# Patient Record
Sex: Male | Born: 1985 | Hispanic: Yes | Marital: Married | Smoking: Never smoker
Health system: Southern US, Community
[De-identification: ages and names within clinical notes are randomized; demographics above are authoritative.]

## PROBLEM LIST (undated history)

## (undated) DIAGNOSIS — I7100 Dissection of unspecified site of aorta: Secondary | ICD-10-CM

## (undated) DIAGNOSIS — I10 Essential (primary) hypertension: Secondary | ICD-10-CM

## (undated) HISTORY — PX: CARDIAC SURGERY: SHX584

---

## 2020-12-22 ENCOUNTER — Other Ambulatory Visit: Payer: Self-pay

## 2020-12-22 ENCOUNTER — Emergency Department (HOSPITAL_COMMUNITY): Payer: BLUE CROSS/BLUE SHIELD

## 2020-12-22 ENCOUNTER — Encounter (HOSPITAL_COMMUNITY): Payer: Self-pay | Admitting: Emergency Medicine

## 2020-12-22 ENCOUNTER — Emergency Department (HOSPITAL_COMMUNITY)
Admission: EM | Admit: 2020-12-22 | Discharge: 2020-12-22 | Disposition: A | Discharge status: Home / Self Care | Payer: BLUE CROSS/BLUE SHIELD | Attending: Emergency Medicine | Admitting: Emergency Medicine

## 2020-12-22 DIAGNOSIS — S79911A Unspecified injury of right hip, initial encounter: Secondary | ICD-10-CM | POA: Diagnosis present

## 2020-12-22 DIAGNOSIS — S300XXA Contusion of lower back and pelvis, initial encounter: Secondary | ICD-10-CM | POA: Diagnosis not present

## 2020-12-22 DIAGNOSIS — W108XXA Fall (on) (from) other stairs and steps, initial encounter: Secondary | ICD-10-CM | POA: Insufficient documentation

## 2020-12-22 DIAGNOSIS — S7001XA Contusion of right hip, initial encounter: Secondary | ICD-10-CM | POA: Insufficient documentation

## 2020-12-22 DIAGNOSIS — Y9301 Activity, walking, marching and hiking: Secondary | ICD-10-CM | POA: Diagnosis not present

## 2020-12-22 MED ORDER — ACETAMINOPHEN 500 MG PO TABS: 1000.0000 mg | ORAL_TABLET | Freq: Four times a day (QID) | ORAL | 0 refills | Status: AC | PRN

## 2020-12-22 NOTE — ED Triage Notes (Signed)
Patient slid down 3 carpeted stairs this AM around 0700, has had R hip/buttocks pain, states it got worse while he was at work, is bruised and swollen.

## 2020-12-22 NOTE — ED Provider Notes (Signed)
[facility]-EMERGENCY DEPT Provider Note   CSN: [id number] Arrival date & time: 12/22/20  1704     History Chief Complaint  Patient presents with   Fall   Hip Pain    Jacob Mccann is a 35 y.o. male.  The history is provided by the patient. No language interpreter was used.  Fall  Hip Pain   35 year old male who presents for evaluation of a recent fall.  Patient report this morning he was walking down the steps to make milk for his infant when he misstepped fell and struck his right hip and buttock against the edge of the steps.  He denies hitting his head or loss of consciousness.  Initially the pain was mild but after going to work and walking around for extended period of time he noticed progressive worsening pain to his right hip and buttock region.  Pain is sharp throbbing achy moderate intensity.  He also noticed a large bruise to his right buttock region.  He denies any significant back pain denies any numbness.  No specific treatment tried.  History reviewed. No pertinent past medical history.  There are no problems to display for this patient.   History reviewed. No pertinent surgical history.     No family history on file.     Home Medications Prior to Admission medications   Not on File    Allergies    Patient has no known allergies.  Review of Systems   Review of Systems  Constitutional:  Negative for fever.  Skin:  Negative for wound.  Neurological:  Negative for numbness.   Physical Exam Updated Vital Signs BP (!) 149/106 (BP Location: Right Arm)    Pulse 100    Temp 98.7 F (37.1 C) (Oral)    Resp 18    Ht 5\' 11"  (1.803 m)    Wt 99.8 kg    SpO2 100%    BMI 30.68 kg/m   Physical Exam Vitals and nursing note reviewed.  Constitutional:      General: He is not in acute distress.    Appearance: He is well-developed.  HENT:     Head: Atraumatic.  Eyes:     Conjunctiva/sclera: Conjunctivae normal.  Abdominal:      Palpations: Abdomen is soft.     Tenderness: There is no abdominal tenderness.  Musculoskeletal:        General: Tenderness (Right hip: Tenderness to lateral and posterior hip near the ecchymotic site however hip with full range of motion and able to ambulate) present.     Cervical back: Neck supple.  Skin:    Findings: No rash.     Comments: Right buttock: There is a moderate size ecchymosis noted to the right gluteal region tenderness to palpation but no active bleeding and no deformity noted.  Neurological:     Mental Status: He is alert and oriented to person, place, and time.    ED Results / Procedures / Treatments   Labs (all labs ordered are listed, but only abnormal results are displayed) Labs Reviewed - No data to display  EKG None  Radiology DG Hip Unilat  With Pelvis 2-3 Views Right  Result Date: 12/22/2020 CLINICAL DATA:  Trauma, fall, pain EXAM: DG HIP (WITH OR WITHOUT PELVIS) 2-3V RIGHT COMPARISON:  None. FINDINGS: No fracture or dislocation is seen. Joint spaces in both hips appear symmetrical. IMPRESSION: No radiographic abnormality is seen in the right hip. Electronically Signed   By: 12/24/2020.D.  On: 12/22/2020 17:35    Procedures Procedures   Medications Ordered in ED Medications - No data to display  ED Course  I have reviewed the triage vital signs and the nursing notes.  Pertinent labs & imaging results that were available during my care of the patient were reviewed by me and considered in my medical decision making (see chart for details).    MDM Rules/Calculators/A&P                         BP (!) 149/106 (BP Location: Right Arm)    Pulse 100    Temp 98.7 F (37.1 C) (Oral)    Resp 18    Ht 5\' 11"  (1.803 m)    Wt 99.8 kg    SpO2 100%    BMI 30.68 kg/m      Final Clinical Impression(s) / ED Diagnoses Final diagnoses:  Contusion of right hip, initial encounter    Rx / DC Orders ED Discharge Orders     None      5:53  PM Patient had a mechanical fall earlier today.  He is here with pain to his right buttock and right hip.  He has a large ecchymosis to his right buttock with tenderness however he is able to range his right hip without difficulty.  X-ray of the hip and pelvis region without acute finding.  RICE therapy recommended.  Otherwise patient stable for discharge.  Work note provided per request.   , PA-C 12/22/20 1757    12/24/20, MD 12/22/20 12/24/20

## 2020-12-22 NOTE — Discharge Instructions (Signed)
You have been evaluated for fall.  Fortunately x-ray of your right hip and pelvis did not show any broken bone.  You do have a moderate sized bruise to the right buttock.  Apply ice pack to affected area several times daily to help ease the discomfort, after 2 to 3 days you may transition over to warm bath for comfort.  Take Tylenol as needed for pain.

## 2021-03-28 ENCOUNTER — Emergency Department (HOSPITAL_COMMUNITY): Payer: BC Managed Care – PPO

## 2021-03-28 ENCOUNTER — Emergency Department (HOSPITAL_COMMUNITY)
Admission: EM | Admit: 2021-03-28 | Discharge: 2021-03-28 | Disposition: A | Discharge status: Home / Self Care | Payer: BC Managed Care – PPO | Attending: Emergency Medicine | Admitting: Emergency Medicine

## 2021-03-28 ENCOUNTER — Other Ambulatory Visit: Payer: Self-pay

## 2021-03-28 ENCOUNTER — Encounter (HOSPITAL_COMMUNITY): Payer: Self-pay

## 2021-03-28 DIAGNOSIS — Z79899 Other long term (current) drug therapy: Secondary | ICD-10-CM | POA: Diagnosis not present

## 2021-03-28 DIAGNOSIS — R0602 Shortness of breath: Secondary | ICD-10-CM | POA: Diagnosis not present

## 2021-03-28 DIAGNOSIS — R0789 Other chest pain: Secondary | ICD-10-CM | POA: Insufficient documentation

## 2021-03-28 DIAGNOSIS — Z7982 Long term (current) use of aspirin: Secondary | ICD-10-CM | POA: Diagnosis not present

## 2021-03-28 HISTORY — DX: Dissection of unspecified site of aorta: I71.00

## 2021-03-28 HISTORY — DX: Essential (primary) hypertension: I10

## 2021-03-28 LAB — BASIC METABOLIC PANEL
Anion gap: 9 (ref 5–15)
BUN: 36 mg/dL — ABNORMAL HIGH (ref 6–20)
CO2: 24 mmol/L (ref 22–32)
Calcium: 9.1 mg/dL (ref 8.9–10.3)
Chloride: 99 mmol/L (ref 98–111)
Creatinine, Ser: 1.52 mg/dL — ABNORMAL HIGH (ref 0.61–1.24)
GFR, Estimated: >60 mL/min (ref >60)
Glucose, Bld: 96 mg/dL (ref 70–99)
Potassium: 3.7 mmol/L (ref 3.5–5.1)
Sodium: 132 mmol/L — ABNORMAL LOW (ref 135–145)

## 2021-03-28 LAB — CBC
HCT: 45.5 % (ref 39.0–52.0)
Hemoglobin: 16 g/dL (ref 13.0–17.0)
MCH: 25.7 pg — ABNORMAL LOW (ref 26.0–34.0)
MCHC: 35.2 g/dL (ref 30.0–36.0)
MCV: 73.2 fL — ABNORMAL LOW (ref 80.0–100.0)
Platelets: 242 10*3/uL (ref 150–400)
RBC: 6.22 MIL/uL — ABNORMAL HIGH (ref 4.22–5.81)
RDW: 14.8 % (ref 11.5–15.5)
WBC: 8 10*3/uL (ref 4.0–10.5)
nRBC: 0 % (ref 0.0–0.2)

## 2021-03-28 LAB — TROPONIN I (HIGH SENSITIVITY): Troponin I (High Sensitivity): 2 ng/L (ref <18)

## 2021-03-28 LAB — D-DIMER, QUANTITATIVE: D-Dimer, Quant: 0.43 ug/mL-FEU (ref 0.00–0.50)

## 2021-03-28 MED ORDER — SODIUM CHLORIDE 0.9 % IV BOLUS
500.0000 mL | Freq: Once | INTRAVENOUS | Status: AC
  Administered 2021-03-28: 500 mL via INTRAVENOUS

## 2021-03-28 MED ORDER — IOHEXOL 350 MG/ML SOLN
100.0000 mL | Freq: Once | INTRAVENOUS | Status: AC | PRN
  Administered 2021-03-28: 100 mL via INTRAVENOUS

## 2021-03-28 NOTE — Discharge Instructions (Signed)
Return for any problem.  ?

## 2021-03-28 NOTE — ED Triage Notes (Signed)
Patient c/o SOB and chest pain x 2 days. Patient states that chest pain is worse with taking a deep breath and with exertion. ? ?

## 2021-03-28 NOTE — ED Provider Notes (Signed)
[facility]-EMERGENCY DEPT Provider Note   CSN: [id number] Arrival date & time: 03/28/21  1720     History  Chief Complaint  Patient presents with   Shortness of Breath   Chest Pain    Jacob Mccann is a 36 y.o. male.  36 year old male with prior medical history as detailed below presents for evaluation.  Patient complains of persistent anterior chest discomfort that has been present for the last 3 days.  Patient reports discomfort present anteriorly.  He complains of mild shortness of breath.  He reports that with deep breath his pain is worse.  He reports significant heavy lifting in the gym nearly every day.  Notably, patient reports history of aortic dissection with repair in 2018.  Procedure was performed in [facility].  Patient does not feel symptoms consistent with prior aortic dissection.    The history is provided by the patient and medical records.  Chest Pain Pain location:  Epigastric Pain quality: aching and pressure   Pain radiates to:  Does not radiate Pain severity:  Mild Onset quality:  Gradual Duration:  3 days Timing:  Constant Progression:  Waxing and waning Chronicity:  New     Home Medications Prior to Admission medications   Medication Sig Start Date End Date Taking? Authorizing Provider  amLODipine (NORVASC) 10 MG tablet Take 10 mg by mouth daily. 03/16/21  Yes [provider]  aspirin 81 MG chewable tablet Chew 81 mg by mouth daily. 10/25/18  Yes [provider]  metoprolol succinate (TOPROL-XL) 25 MG 24 hr tablet Take 25 mg by mouth daily. 03/16/21  Yes [provider]  Multiple Vitamin (MULTIVITAMIN) tablet Take 3 tablets by mouth daily.   Yes [provider]  Omega-3 Fatty Acids (FISH OIL) 1000 MG CAPS Take 1,000 mg by mouth daily.   Yes [provider]  acetaminophen (TYLENOL) 500 MG tablet Take 2 tablets (1,000 mg total) by mouth every 6 (six)  hours as needed for mild pain or moderate pain. Patient not taking: Reported on 03/28/2021 12/22/20   Fayrene Helper, PA-C      Allergies    Patient has no known allergies.    Review of Systems   Review of Systems  Cardiovascular:  Positive for chest pain.  All other systems reviewed and are negative.  Physical Exam Updated Vital Signs BP (!) 166/104   Pulse 77   Temp 98 F (36.7 C) (Oral)   Resp (!) 21   Ht 6' (1.829 m)   Wt 97.5 kg   SpO2 100%   BMI 29.16 kg/m  Physical Exam Vitals and nursing note reviewed.  Constitutional:      General: He is not in acute distress.    Appearance: Normal appearance. He is well-developed.  HENT:     Head: Normocephalic and atraumatic.  Eyes:     Conjunctiva/sclera: Conjunctivae normal.     Pupils: Pupils are equal, round, and reactive to light.  Cardiovascular:     Rate and Rhythm: Normal rate and regular rhythm.     Heart sounds: Normal heart sounds.  Pulmonary:     Effort: Pulmonary effort is normal. No respiratory distress.     Breath sounds: Normal breath sounds.  Abdominal:     General: There is no distension.     Palpations: Abdomen is soft.     Tenderness: There is no abdominal tenderness.  Musculoskeletal:        General: No deformity.  Normal range of motion.     Cervical back: Normal range of motion and neck supple.  Skin:    General: Skin is warm and dry.  Neurological:     General: No focal deficit present.     Mental Status: He is alert and oriented to person, place, and time.    ED Results / Procedures / Treatments   Labs (all labs ordered are listed, but only abnormal results are displayed) Labs Reviewed  BASIC METABOLIC PANEL - Abnormal; Notable for the following components:      Result Value   Sodium 132 (*)    BUN 36 (*)    Creatinine, Ser 1.52 (*)    All other components within normal limits  CBC - Abnormal; Notable for the following components:   RBC 6.22 (*)    MCV 73.2 (*)    MCH 25.7 (*)    All  other components within normal limits  D-DIMER, QUANTITATIVE  TROPONIN I (HIGH SENSITIVITY)    EKG EKG Interpretation  Date/Time:  Tuesday March 28 2021 21:43:49 EDT Ventricular Rate:  78 PR Interval:  201 QRS Duration: 99 QT Interval:  364 QTC Calculation: 415 R Axis:   58 Text Interpretation: Sinus rhythm Probable left atrial enlargement Confirmed by Kristine Royal [phone]) on 03/28/2021 9:55:32 PM  Radiology DG Chest 2 View  Result Date: 03/28/2021 CLINICAL DATA:  Chest pain and shortness of breath for the past 2 days. EXAM: CHEST - 2 VIEW COMPARISON:  None. FINDINGS: The heart size and mediastinal contours are within normal limits. Normal pulmonary vascularity. No focal consolidation, pleural effusion, or pneumothorax. No acute osseous abnormality. Prior median sternotomy. Right subclavian surgical clips. IMPRESSION: No active cardiopulmonary disease. Electronically Signed   By: Obie Dredge M.D.   On: 03/28/2021 19:06   CT Angio Chest/Abd/Pel for Dissection W and/or Wo Contrast  Result Date: 03/28/2021 CLINICAL DATA:  Acute aortic syndrome (AAS) suspected Hx of Prior A Dissection with repair (prox Aortic graft) in [place] at [facility], [place] in 2018. 2 days of shortness of breath and substernal chest pain. Chest pain is described as worse with movement and with deep breaths. EXAM: CT ANGIOGRAPHY CHEST, ABDOMEN AND PELVIS TECHNIQUE: Non-contrast CT of the chest was initially obtained. Multidetector CT imaging through the chest, abdomen and pelvis was performed using the standard protocol during bolus administration of intravenous contrast. Multiplanar reconstructed images and MIPs were obtained and reviewed to evaluate the vascular anatomy. RADIATION DOSE REDUCTION: This exam was performed according to the departmental dose-optimization program which includes automated exposure control, adjustment of the mA and/or kV according to patient size and/or use of iterative reconstruction  technique. CONTRAST:  OMNIPAQUE IOHEXOL 350 MG/ML SOLN COMPARISON:  None. FINDINGS: CTA CHEST FINDINGS Cardiovascular: Preferential opacification of the thoracic aorta. Ascending thoracic aorta repair. No evidence of thoracic aortic aneurysm or dissection. No atherosclerotic plaque of the thoracic aorta. Left anterior descending coronary artery calcifications. Normal heart size. No significant pericardial effusion. No central or proximal segmental pulmonary embolus. Mediastinum/Nodes: No enlarged mediastinal, hilar, or axillary lymph nodes. Thyroid gland, trachea, and esophagus demonstrate no significant findings. Lungs/Pleura: Mild bilateral upper lobe peribronchovascular tree-in-bud nodularity. No pulmonary nodule. No pulmonary mass. No pleural effusion. No pneumothorax. Musculoskeletal: No chest wall abnormality. No suspicious lytic or blastic osseous lesions. No acute displaced fracture. Review of the MIP images confirms the above findings. CTA ABDOMEN AND PELVIS FINDINGS VASCULAR Aorta: Normal caliber aorta without aneurysm, dissection, vasculitis or significant stenosis. Celiac: Patent without  evidence of aneurysm, dissection, vasculitis or significant stenosis. SMA: Patent without evidence of aneurysm, dissection, vasculitis or significant stenosis. Renals: Both renal arteries are patent without evidence of aneurysm, dissection, vasculitis, fibromuscular dysplasia or significant stenosis. IMA: Patent without evidence of aneurysm, dissection, vasculitis or significant stenosis. Inflow: Patent without evidence of aneurysm, dissection, vasculitis or significant stenosis. Veins: No obvious venous abnormality within the limitations of this arterial phase study. Review of the MIP images confirms the above findings. NON-VASCULAR Hepatobiliary: No focal liver abnormality. No gallstones, gallbladder wall thickening, or pericholecystic fluid. No biliary dilatation. Pancreas: No focal lesion. Normal pancreatic  contour. No surrounding inflammatory changes. No main pancreatic ductal dilatation. Spleen: Normal in size without focal abnormality. Adrenals/Urinary Tract: No adrenal nodule bilaterally. Bilateral kidneys enhance symmetrically. No hydronephrosis. No hydroureter. The urinary bladder is unremarkable. Stomach/Bowel: Stomach is within normal limits. No evidence of bowel wall thickening or dilatation. Appendix appears normal. Lymphatic: No lymphadenopathy. Reproductive: Prostate is unremarkable. Other: No intraperitoneal free fluid. No intraperitoneal free gas. No organized fluid collection. Musculoskeletal: Tiny fat containing umbilical hernia. No suspicious lytic or blastic osseous lesions. No acute displaced fracture. Multilevel degenerative changes of the spine. Review of the MIP images confirms the above findings. IMPRESSION: 1. No acute aortic abnormality in a patient status post ascending thoracic aorta repair. 2. Left anterior descending coronary calcification. 3. No central or proximal segmental pulmonary embolus. 4. Mild bilateral upper lobe peribronchovascular tree-in-bud nodularity suggestive of developing atypical pulmonary infection. No follow-up needed if patient is low-risk (and has no known or suspected primary neoplasm). Non-contrast chest CT can be considered in 12 months if patient is high-risk. This recommendation follows the consensus statement: Guidelines for Management of Incidental Pulmonary Nodules Detected on CT Images: From the Fleischner Society 2017; Radiology 2017; 284:228-243. 5. Tiny fat containing umbilical hernia. Electronically Signed   By: Tish Frederickson M.D.   On: 03/28/2021 22:43    Procedures Procedures    Medications Ordered in ED Medications  sodium chloride 0.9 % bolus 500 mL (500 mLs Intravenous New Bag/Given 03/28/21 2117)  iohexol (OMNIPAQUE) 350 MG/ML injection 100 mL (100 mLs Intravenous Contrast Given 03/28/21 2207)    ED Course/ Medical Decision Making/ A&P                            Medical Decision Making Amount and/or Complexity of Data Reviewed Radiology: ordered.  Risk Prescription drug management.    Medical Screen Complete  This patient presented to the ED with complaint of chest pain.  This complaint involves an extensive number of treatment options. The initial differential diagnosis includes, but is not limited to, atypical chest pain, aortic dissection, PE, other intrathoracic pathology, etc.  This presentation is: Acute, Chronic, Self-Limited, Previously Undiagnosed, Uncertain Prognosis, Complicated, Systemic Symptoms, and Threat to Life/Bodily Function  Patient with prior history of aortic dissection repaired in 2018 presents with complaint of anterior chest discomfort  Patient with symptoms present for greater than 3 days.  Patient's presentation is entirely consistent with likely musculoskeletal strain.    However, patient is very concerned about possibility of recurrent dissection.  He is requesting CT rule out.  Patient's labs are without significant abnormality.  Troponin is 2.  D-dimer is negative.  CTA of chest does not reveal new dissection.  Patient is reassured by ED evaluation and work-up.  Does understand need for close outpatient follow-up.  Strict return precautions given and understood.     Co morbidities that  complicated the patient's evaluation  Prior history of aortic dissection   Additional history obtained:  External records from outside sources obtained and reviewed including prior ED visits and prior Inpatient records.    Lab Tests:  I ordered and personally interpreted labs.  The pertinent results include: CBC, BMP, troponin, D-dimer   Imaging Studies ordered:  I ordered imaging studies including chest x-ray, CT angio chest I independently visualized and interpreted obtained imaging which showed no acute pathology I agree with the radiologist interpretation.   Cardiac  Monitoring:  The patient was maintained on a cardiac monitor.  I personally viewed and interpreted the cardiac monitor which showed an underlying rhythm of: NSR   Problem List / ED Course:  Atypical chest discomfort   Reevaluation:  After the interventions noted above, I reevaluated the patient and found that they have: improved  Disposition:  After consideration of the diagnostic results and the patients response to treatment, I feel that the patent would benefit from close outpatient follow-up.          Final Clinical Impression(s) / ED Diagnoses Final diagnoses:  Atypical chest pain    Rx / DC Orders ED Discharge Orders     None         Wynetta Fines, MD 03/28/21 2300

## 2021-03-28 NOTE — ED Provider Triage Note (Signed)
Emergency Medicine Provider Triage Evaluation Note ? ?Jacob Mccann , a 36 y.o. male  was evaluated in triage.  Pt complains of 2 days of shortness of breath and substernal chest pain.  Chest pain is described as worse with movement and with deep breaths.  Patient describes the pain as more of a pressure patient is taking no medications at home.  Patient endorses decreased appetite. Denies radiation of symptoms.  Denies nausea.  Denies abdominal pain ? ?Review of Systems  ?Positive: Chest pain, shortness of breath ?Negative: Abdominal pain, nausea ? ?Physical Exam  ?BP (!) 144/95   Pulse 85   Temp 98 ?F (36.7 ?C) (Oral)   Resp 16   Ht 6' (1.829 m)   Wt 97.5 kg   SpO2 94%   BMI 29.16 kg/m?  ?Gen:   Awake, no distress  3 ?Resp:  Normal effort  ?MSK:   Moves extremities without difficulty  ?Other:   ? ?Medical Decision Making  ?Medically screening exam initiated at 6:09 PM.  Appropriate orders placed.  Tobie Hemphill was informed that the remainder of the evaluation will be completed by another provider, this initial triage assessment does not replace that evaluation, and the importance of remaining in the ED until their evaluation is complete. ? ? ?  ?Dorothyann Peng, PA-C ?03/28/21 1818 ? ?

## 2022-11-20 IMAGING — CT CT ANGIO CHEST-ABD-PELV FOR DISSECTION W/ AND WO/W CM
2 of 8 series · 13 of 46 positions shown, 15 images · non-contrast
Comparison: None.

CLINICAL DATA: Acute aortic syndrome (AAS) suspected Hx of Prior A
Dissection with repair (prox Aortic graft) in [place] at [place], [place] in [AGE] of shortness of breath and substernal chest
pain. Chest pain is described as worse with movement and with deep
breaths.

EXAM:
CT ANGIOGRAPHY CHEST, ABDOMEN AND PELVIS
TECHNIQUE: Non-contrast CT of the chest was initially obtained.

[Series 6: axial arterial · axial · arterial · 0.98mm/px · z∈[-661,-94]mm · 10 of 231 slices shown, 12 images]
[im 21/231  soft-tissue]
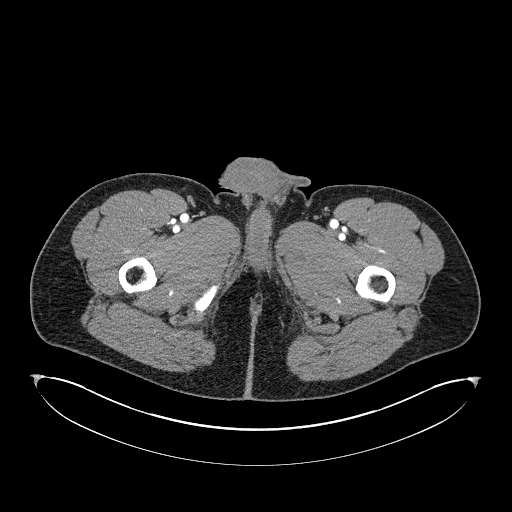
[im 21/231  bone]
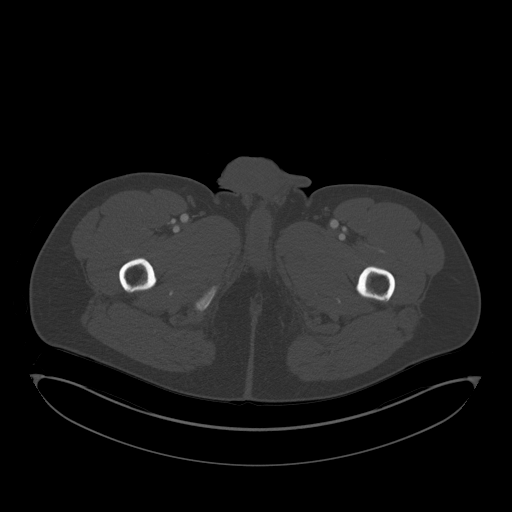
[im 42/231  soft-tissue]
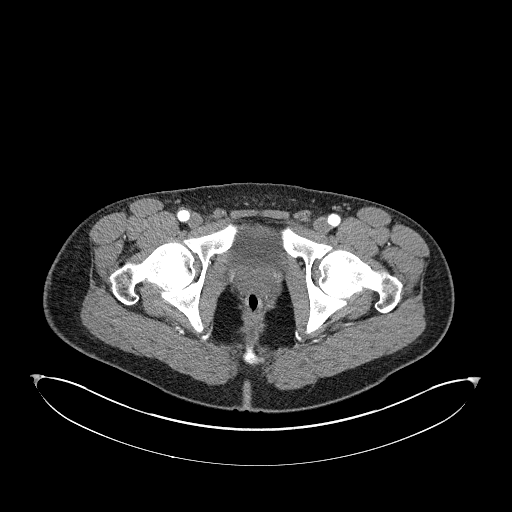
[im 63/231  soft-tissue]
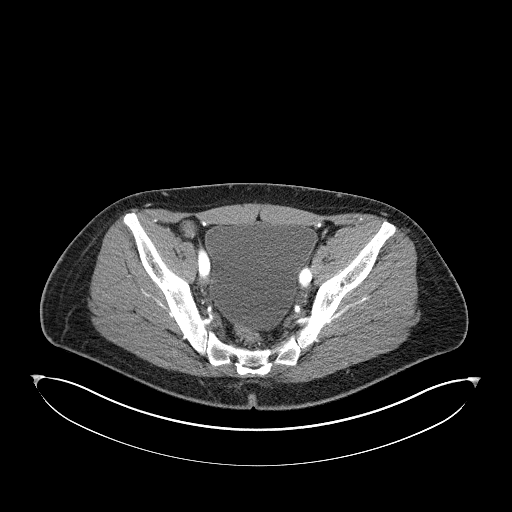
[im 84/231  soft-tissue]
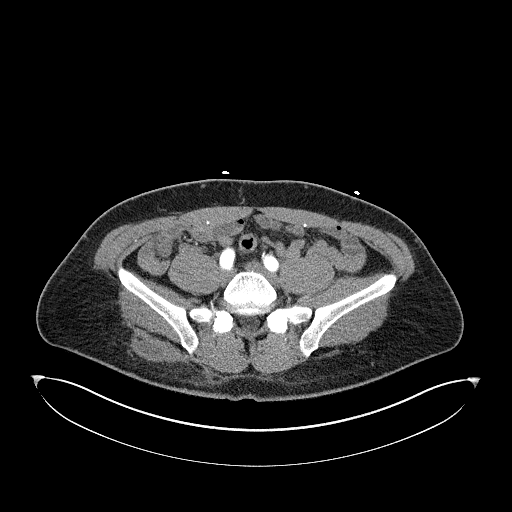
[im 105/231  soft-tissue]
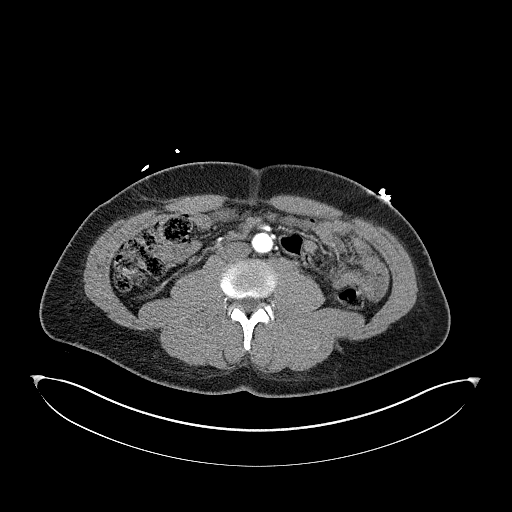
[im 126/231  soft-tissue]
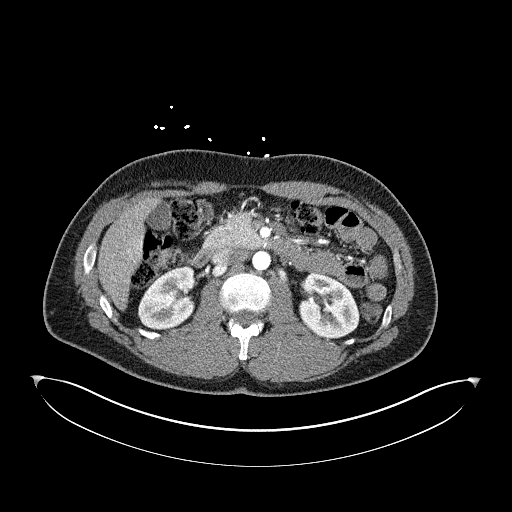
[im 147/231  soft-tissue]
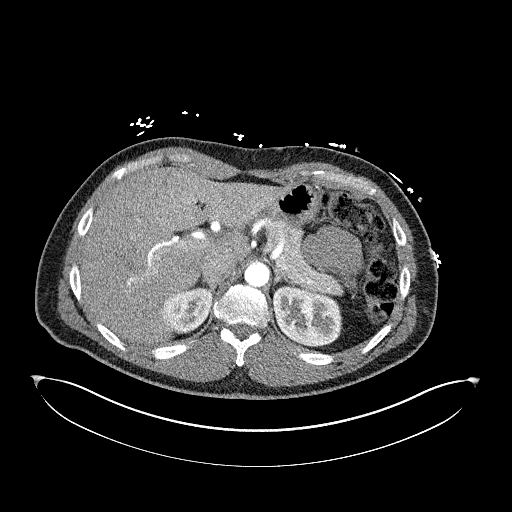
[im 168/231  soft-tissue]
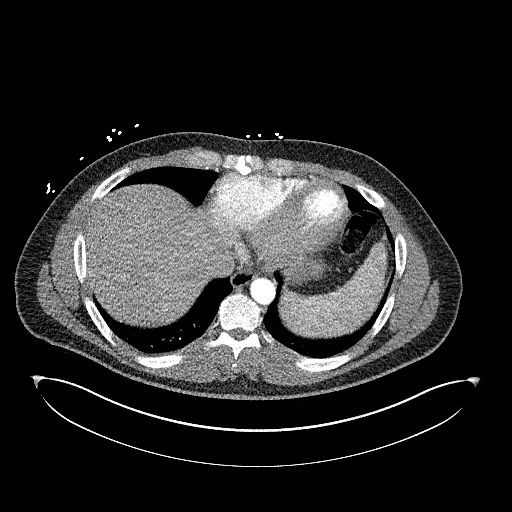
[im 189/231  soft-tissue]
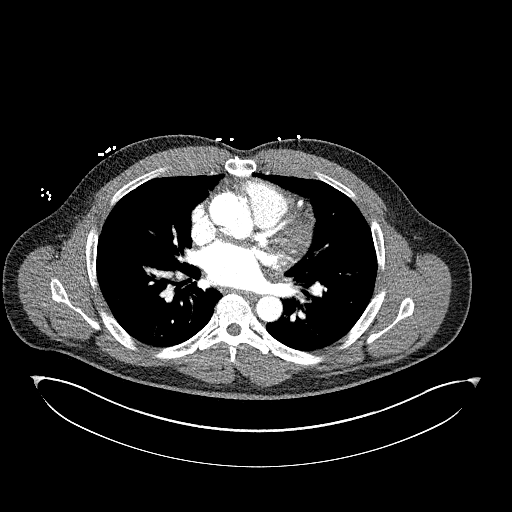
[im 189/231  bone]
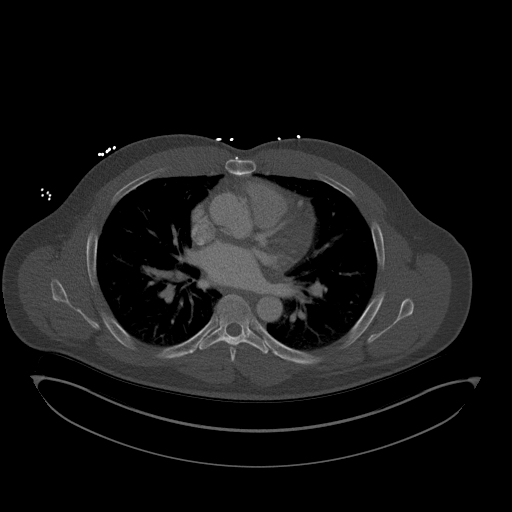
[im 210/231  soft-tissue]
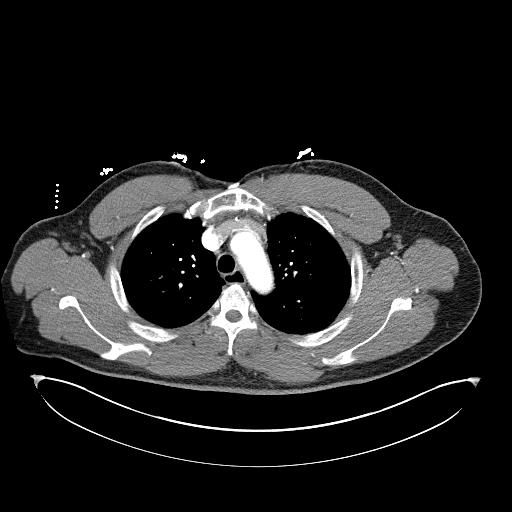

[Series 7: coronals · coronal · 1.03mm/px · 3 of 148 slices shown]
[im 37/148  soft-tissue]
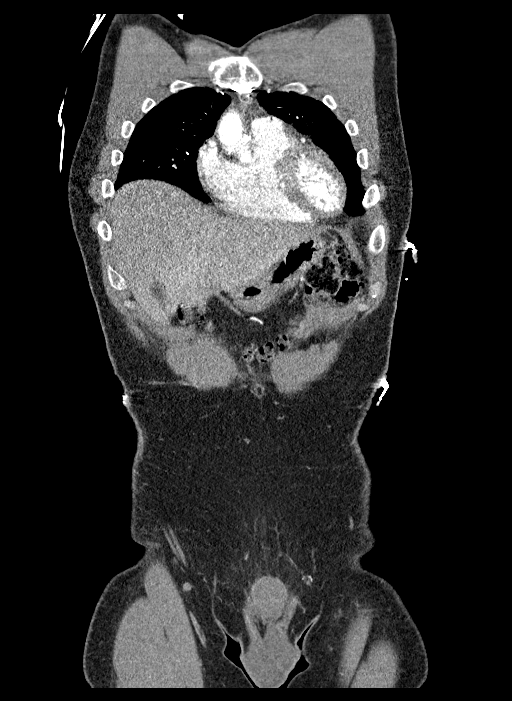
[im 74/148  soft-tissue]
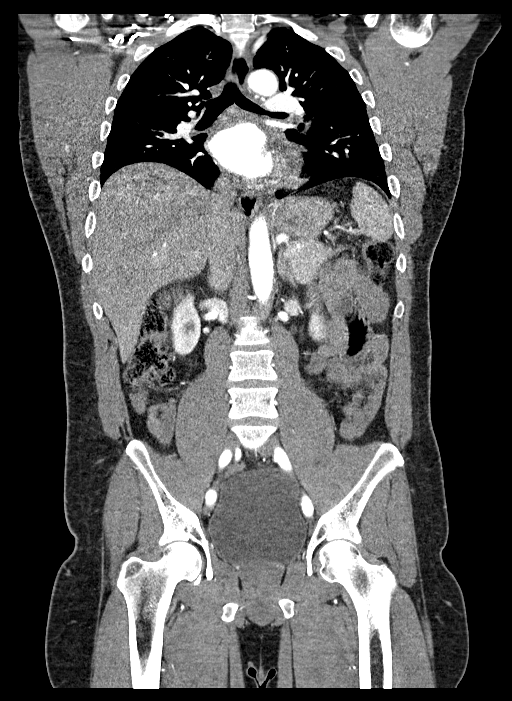
[im 111/148  soft-tissue]
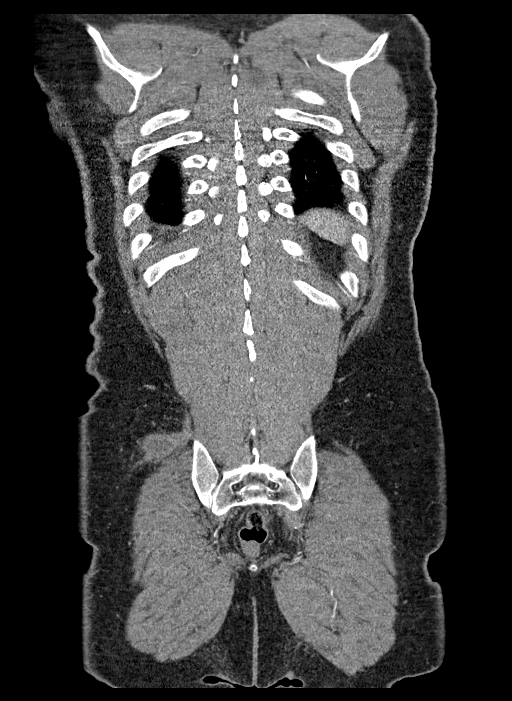

[13 of 46 positions shown; findings below may reference images not displayed]

Multidetector CT imaging through the chest, abdomen and pelvis was
performed using the standard protocol during bolus administration of
intravenous contrast. Multiplanar reconstructed images and MIPs were
obtained and reviewed to evaluate the vascular anatomy.

RADIATION DOSE REDUCTION: This exam was performed according to the
departmental dose-optimization program which includes automated
exposure control, adjustment of the mA and/or kV according to
patient size and/or use of iterative reconstruction technique.

CONTRAST:  100mL OMNIPAQUE IOHEXOL 350 MG/ML SOLN
FINDINGS: CTA CHEST FINDINGS

Cardiovascular:

Preferential opacification of the thoracic aorta. Ascending thoracic
aorta repair. No evidence of thoracic aortic aneurysm or dissection.
No atherosclerotic plaque of the thoracic aorta. Left anterior
descending coronary artery calcifications.

Normal heart size. No significant pericardial effusion.

No central or proximal segmental pulmonary embolus.

Mediastinum/Nodes: No enlarged mediastinal, hilar, or axillary lymph
nodes. Thyroid gland, trachea, and esophagus demonstrate no
significant findings.

Lungs/Pleura: Mild bilateral upper lobe peribronchovascular
tree-in-bud nodularity. No pulmonary nodule. No pulmonary mass. No
pleural effusion. No pneumothorax.

Musculoskeletal:

No chest wall abnormality.

No suspicious lytic or blastic osseous lesions. No acute displaced
fracture.

Review of the MIP images confirms the above findings.

CTA ABDOMEN AND PELVIS FINDINGS

VASCULAR

Aorta: Normal caliber aorta without aneurysm, dissection, vasculitis
or significant stenosis.

Celiac: Patent without evidence of aneurysm, dissection, vasculitis
or significant stenosis.

SMA: Patent without evidence of aneurysm, dissection, vasculitis or
significant stenosis.

Renals: Both renal arteries are patent without evidence of aneurysm,
dissection, vasculitis, fibromuscular dysplasia or significant
stenosis.

IMA: Patent without evidence of aneurysm, dissection, vasculitis or
significant stenosis.

Inflow: Patent without evidence of aneurysm, dissection, vasculitis
or significant stenosis.

Veins: No obvious venous abnormality within the limitations of this
arterial phase study.

Review of the MIP images confirms the above findings.

NON-VASCULAR

Hepatobiliary: No focal liver abnormality. No gallstones,
gallbladder wall thickening, or pericholecystic fluid. No biliary
dilatation.

Pancreas: No focal lesion. Normal pancreatic contour. No surrounding
inflammatory changes. No main pancreatic ductal dilatation.

Spleen: Normal in size without focal abnormality.

Adrenals/Urinary Tract:

No adrenal nodule bilaterally.

Bilateral kidneys enhance symmetrically.

No hydronephrosis. No hydroureter.

The urinary bladder is unremarkable.

Stomach/Bowel: Stomach is within normal limits. No evidence of bowel
wall thickening or dilatation. Appendix appears normal.

Lymphatic: No lymphadenopathy.

Reproductive: Prostate is unremarkable.

Other: No intraperitoneal free fluid. No intraperitoneal free gas.
No organized fluid collection.

Musculoskeletal:

Tiny fat containing umbilical hernia.

No suspicious lytic or blastic osseous lesions. No acute displaced
fracture. Multilevel degenerative changes of the spine.

Review of the MIP images confirms the above findings.
IMPRESSION: 1. No acute aortic abnormality in a patient status post ascending
thoracic aorta repair.
2. Left anterior descending coronary calcification.
3. No central or proximal segmental pulmonary embolus.
4. Mild bilateral upper lobe peribronchovascular tree-in-bud
nodularity suggestive of developing atypical pulmonary infection. No
follow-up needed if patient is low-risk (and has no known or
suspected primary neoplasm). Non-contrast chest CT can be considered
in 12 months if patient is high-risk. This recommendation follows
the consensus statement: Guidelines for Management of Incidental
Pulmonary Nodules Detected on CT Images: From the [HOSPITAL]
5. Tiny fat containing umbilical hernia.

## 2022-11-20 IMAGING — CR DG CHEST 2V
2 series · 2 of 2 positions shown · non-contrast
Comparison: None.

CLINICAL DATA: Chest pain and shortness of breath for the past 2
days.

EXAM:
CHEST - 2 VIEW

[w chest pa]
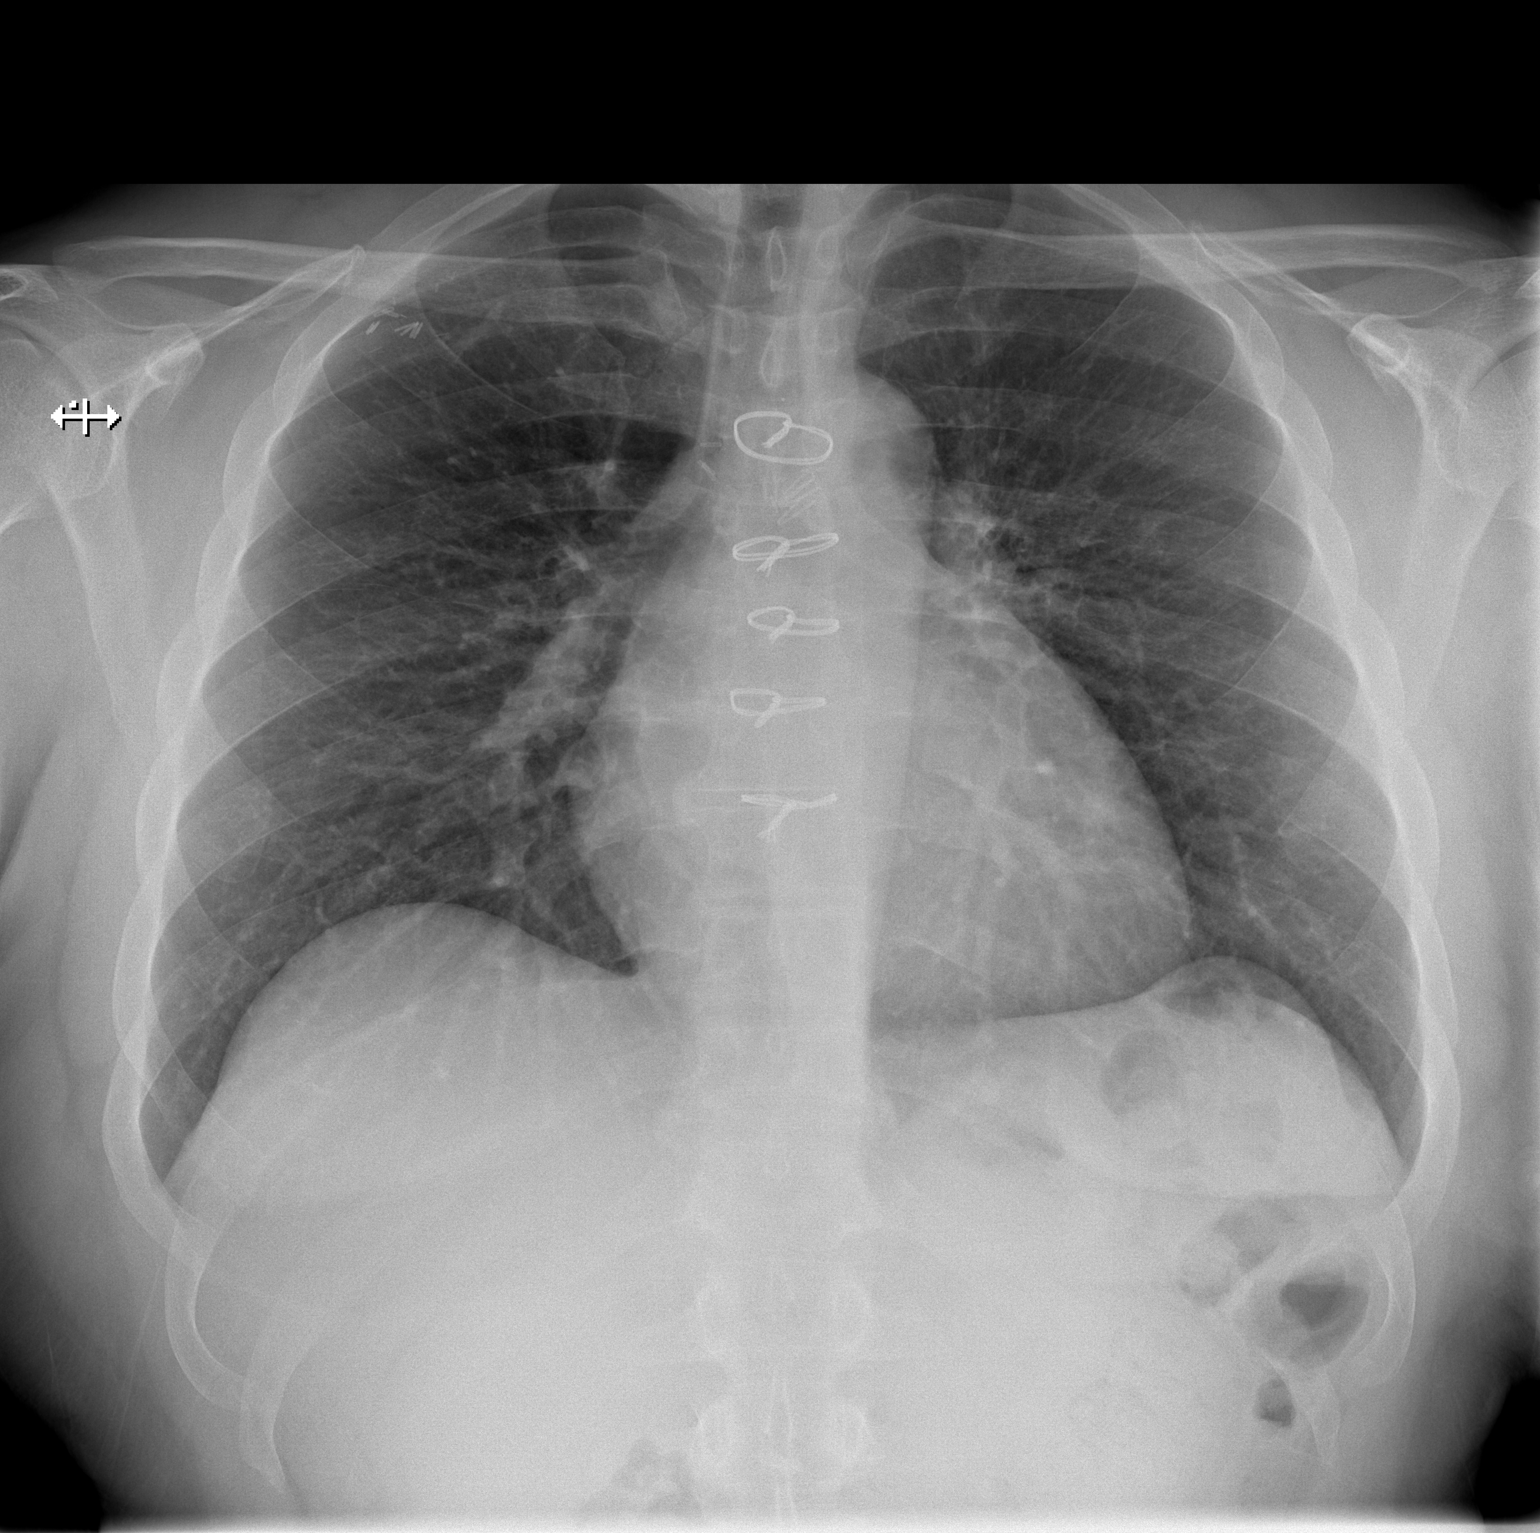

[w chest lat]
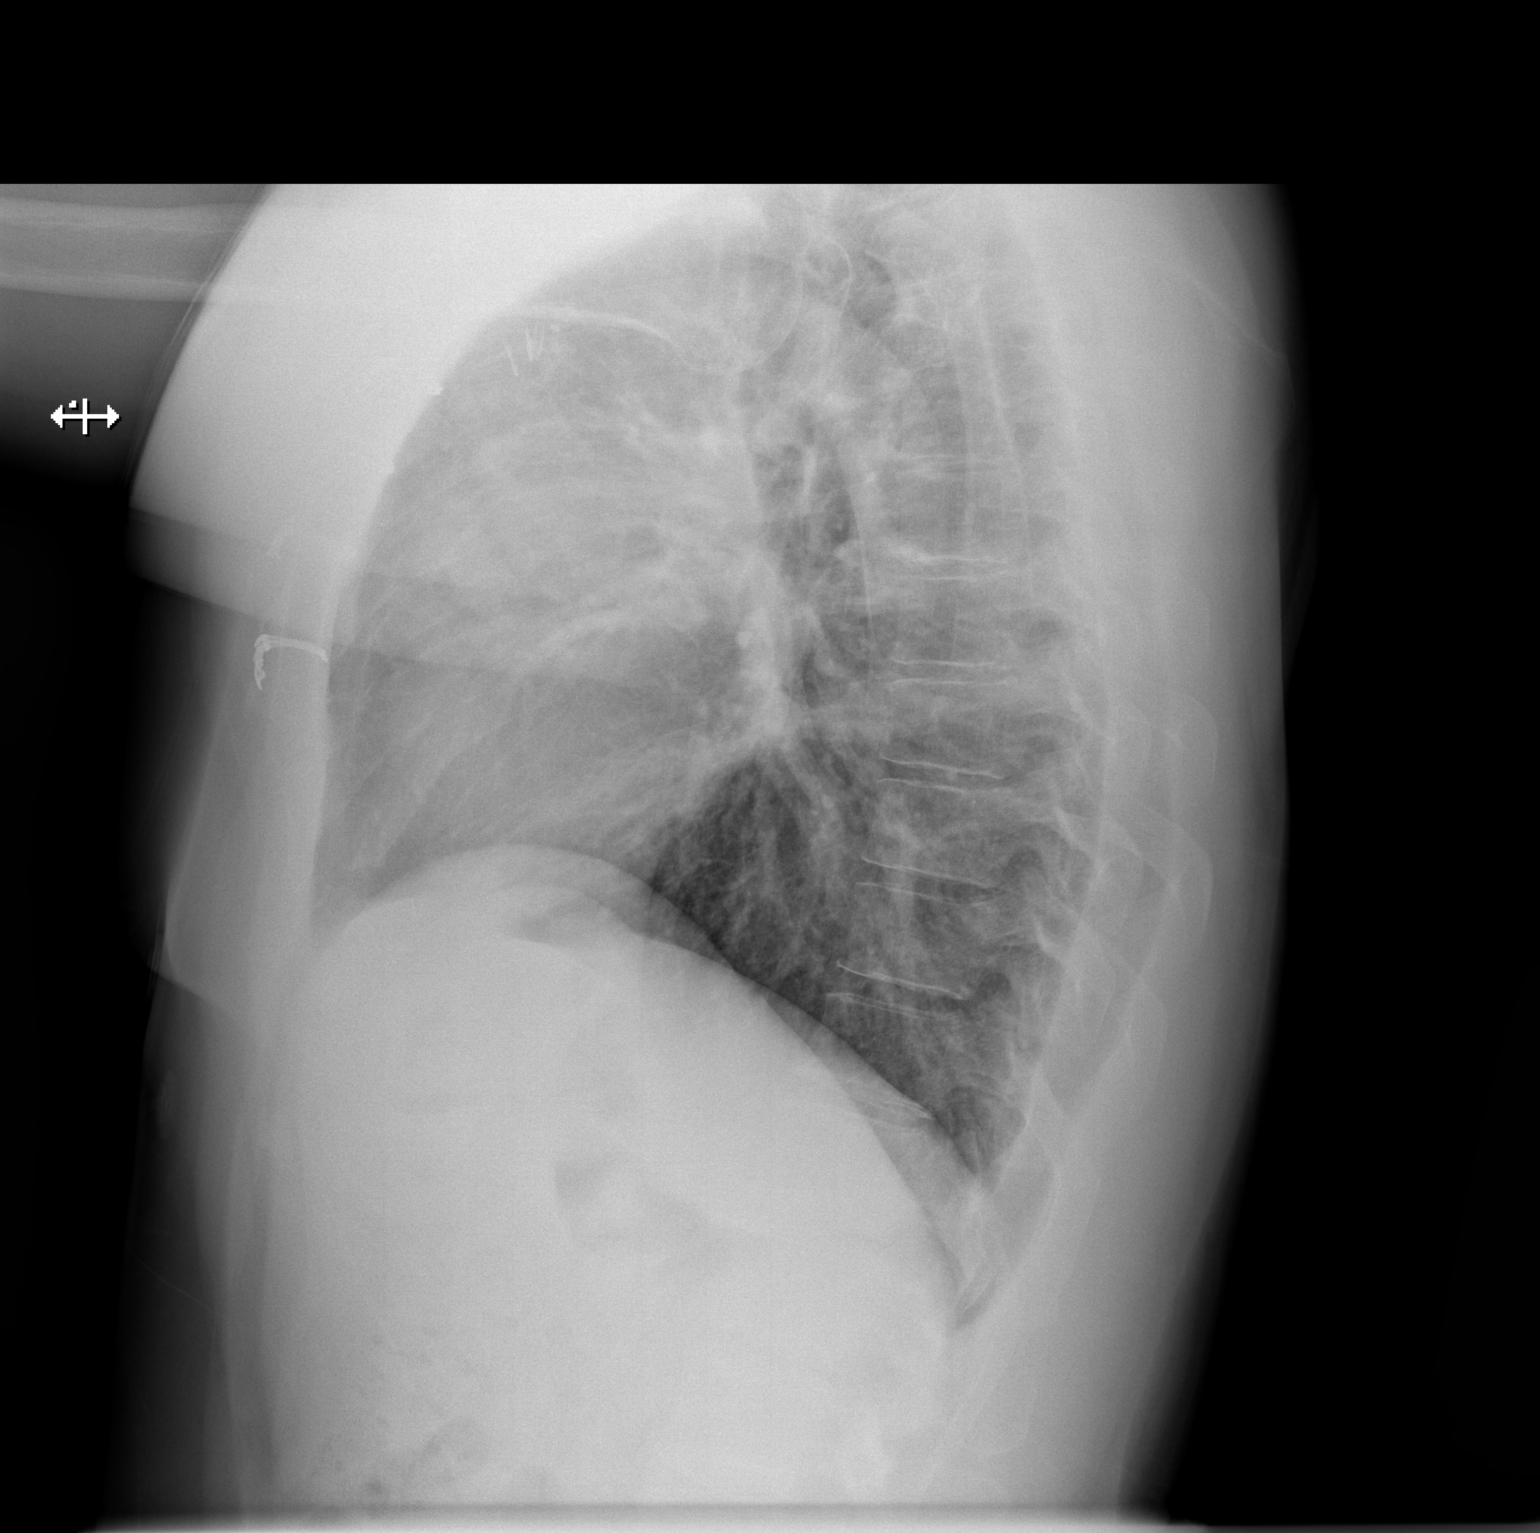

[2 of 2 positions shown; findings below may reference images not displayed]

FINDINGS: The heart size and mediastinal contours are within normal limits.
Normal pulmonary vascularity. No focal consolidation, pleural
effusion, or pneumothorax. No acute osseous abnormality. Prior
median sternotomy. Right subclavian surgical clips.
IMPRESSION: No active cardiopulmonary disease.

## 2024-02-07 ENCOUNTER — Emergency Department (HOSPITAL_BASED_OUTPATIENT_CLINIC_OR_DEPARTMENT_OTHER)

## 2024-02-07 ENCOUNTER — Emergency Department (HOSPITAL_BASED_OUTPATIENT_CLINIC_OR_DEPARTMENT_OTHER): Admitting: Radiology

## 2024-02-07 ENCOUNTER — Other Ambulatory Visit: Payer: Self-pay

## 2024-02-07 ENCOUNTER — Encounter (HOSPITAL_BASED_OUTPATIENT_CLINIC_OR_DEPARTMENT_OTHER): Payer: Self-pay

## 2024-02-07 ENCOUNTER — Emergency Department (HOSPITAL_BASED_OUTPATIENT_CLINIC_OR_DEPARTMENT_OTHER)
Admission: EM | Admit: 2024-02-07 | Discharge: 2024-02-07 | Disposition: A | Discharge status: Home / Self Care | Attending: Emergency Medicine | Admitting: Emergency Medicine

## 2024-02-07 DIAGNOSIS — R079 Chest pain, unspecified: Secondary | ICD-10-CM | POA: Diagnosis present

## 2024-02-07 DIAGNOSIS — R42 Dizziness and giddiness: Secondary | ICD-10-CM | POA: Insufficient documentation

## 2024-02-07 DIAGNOSIS — Z7982 Long term (current) use of aspirin: Secondary | ICD-10-CM | POA: Insufficient documentation

## 2024-02-07 DIAGNOSIS — Z79899 Other long term (current) drug therapy: Secondary | ICD-10-CM | POA: Diagnosis not present

## 2024-02-07 DIAGNOSIS — R0789 Other chest pain: Secondary | ICD-10-CM | POA: Insufficient documentation

## 2024-02-07 DIAGNOSIS — I10 Essential (primary) hypertension: Secondary | ICD-10-CM | POA: Insufficient documentation

## 2024-02-07 LAB — BASIC METABOLIC PANEL WITH GFR
Anion gap: 9 (ref 5–15)
BUN: 28 mg/dL — ABNORMAL HIGH (ref 6–20)
CO2: 27 mmol/L (ref 22–32)
Calcium: 9.8 mg/dL (ref 8.9–10.3)
Chloride: 105 mmol/L (ref 98–111)
Creatinine, Ser: 1.28 mg/dL — ABNORMAL HIGH (ref 0.61–1.24)
GFR, Estimated: >60 mL/min
Glucose, Bld: 93 mg/dL (ref 70–99)
Potassium: 4.3 mmol/L (ref 3.5–5.1)
Sodium: 141 mmol/L (ref 135–145)

## 2024-02-07 LAB — CBC WITH DIFFERENTIAL/PLATELET
Abs Immature Granulocytes: 0.01 10*3/uL (ref 0.00–0.07)
Basophils Absolute: 0 10*3/uL (ref 0.0–0.1)
Basophils Relative: 1 %
Eosinophils Absolute: 0.1 10*3/uL (ref 0.0–0.5)
Eosinophils Relative: 2 %
HCT: 40.2 % (ref 39.0–52.0)
Hemoglobin: 14.6 g/dL (ref 13.0–17.0)
Immature Granulocytes: 0 %
Lymphocytes Relative: 46 %
Lymphs Abs: 1.7 10*3/uL (ref 0.7–4.0)
MCH: 26.4 pg (ref 26.0–34.0)
MCHC: 36.3 g/dL — ABNORMAL HIGH (ref 30.0–36.0)
MCV: 72.6 fL — ABNORMAL LOW (ref 80.0–100.0)
Monocytes Absolute: 0.3 10*3/uL (ref 0.1–1.0)
Monocytes Relative: 7 %
Neutro Abs: 1.6 10*3/uL — ABNORMAL LOW (ref 1.7–7.7)
Neutrophils Relative %: 44 %
Platelets: 161 10*3/uL (ref 150–400)
RBC: 5.54 MIL/uL (ref 4.22–5.81)
RDW: 15 % (ref 11.5–15.5)
WBC: 3.6 10*3/uL — ABNORMAL LOW (ref 4.0–10.5)
nRBC: 0 % (ref 0.0–0.2)

## 2024-02-07 LAB — TROPONIN T, HIGH SENSITIVITY: Troponin T High Sensitivity: 9 ng/L (ref 0–19)

## 2024-02-07 MED ORDER — IOHEXOL 350 MG/ML SOLN
100.0000 mL | Freq: Once | INTRAVENOUS | Status: AC | PRN
  Administered 2024-02-07: 100 mL via INTRAVENOUS

## 2024-02-07 NOTE — Discharge Instructions (Signed)
 All of your blood work and imaging has come back looking normal Follow up with your PCP outpatient
